# Patient Record
Sex: Male | Born: 1990 | Hispanic: Yes | Marital: Single | Smoking: Never smoker
Health system: Southern US, Community
[De-identification: ages and names within clinical notes are randomized; demographics above are authoritative.]

---

## 2015-01-25 ENCOUNTER — Emergency Department
Admission: EM | Admit: 2015-01-25 | Discharge: 2015-01-25 | Disposition: A | Discharge status: Home / Self Care | Payer: Self-pay | Attending: Emergency Medicine | Admitting: Emergency Medicine

## 2015-01-25 ENCOUNTER — Encounter: Payer: Self-pay | Admitting: Emergency Medicine

## 2015-01-25 ENCOUNTER — Emergency Department: Payer: Self-pay

## 2015-01-25 DIAGNOSIS — S0191XA Laceration without foreign body of unspecified part of head, initial encounter: Secondary | ICD-10-CM

## 2015-01-25 DIAGNOSIS — S0990XA Unspecified injury of head, initial encounter: Secondary | ICD-10-CM

## 2015-01-25 DIAGNOSIS — Y9389 Activity, other specified: Secondary | ICD-10-CM | POA: Insufficient documentation

## 2015-01-25 DIAGNOSIS — Y998 Other external cause status: Secondary | ICD-10-CM | POA: Insufficient documentation

## 2015-01-25 DIAGNOSIS — S0101XA Laceration without foreign body of scalp, initial encounter: Secondary | ICD-10-CM | POA: Insufficient documentation

## 2015-01-25 DIAGNOSIS — Y9289 Other specified places as the place of occurrence of the external cause: Secondary | ICD-10-CM | POA: Insufficient documentation

## 2015-01-25 MED ORDER — LIDOCAINE HCL (PF) 1 % IJ SOLN
INTRAMUSCULAR | Status: AC
  Filled 2015-01-25: qty 5

## 2015-01-25 MED ORDER — LIDOCAINE-EPINEPHRINE-TETRACAINE (LET) SOLUTION
3.0000 mL | Freq: Once | NASAL | Status: AC
  Administered 2015-01-25: 3 mL via TOPICAL
  Filled 2015-01-25: qty 3

## 2015-01-25 NOTE — ED Notes (Signed)
md in to assess pt.  
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
## 2015-01-25 NOTE — ED Notes (Signed)
Patient transported to CT 

## 2015-01-25 NOTE — Discharge Instructions (Signed)
Head Injury °You have received a head injury. It does not appear serious at this time. Headaches and vomiting are common following head injury. It should be easy to awaken from sleeping. Sometimes it is necessary for you to stay in the emergency department for a while for observation. Sometimes admission to the hospital may be needed. After injuries such as yours, most problems occur within the first 24 hours, but side effects may occur up to 7-10 days after the injury. It is important for you to carefully monitor your condition and contact your health care provider or seek immediate medical care if there is a change in your condition. °WHAT ARE THE TYPES OF HEAD INJURIES? °Head injuries can be as minor as a bump. Some head injuries can be more severe. More severe head injuries include: °· A jarring injury to the brain (concussion). °· A bruise of the brain (contusion). This mean there is bleeding in the brain that can cause swelling. °· A cracked skull (skull fracture). °· Bleeding in the brain that collects, clots, and forms a bump (hematoma). °WHAT CAUSES A HEAD INJURY? °A serious head injury is most likely to happen to someone who is in a car wreck and is not wearing a seat belt. Other causes of major head injuries include bicycle or motorcycle accidents, sports injuries, and falls. °HOW ARE HEAD INJURIES DIAGNOSED? °A complete history of the event leading to the injury and your current symptoms will be helpful in diagnosing head injuries. Many times, pictures of the brain, such as CT or MRI are needed to see the extent of the injury. Often, an overnight hospital stay is necessary for observation.  °WHEN SHOULD I SEEK IMMEDIATE MEDICAL CARE?  °You should get help right away if: °· You have confusion or drowsiness. °· You feel sick to your stomach (nauseous) or have continued, forceful vomiting. °· You have dizziness or unsteadiness that is getting worse. °· You have severe, continued headaches not relieved by  medicine. Only take over-the-counter or prescription medicines for pain, fever, or discomfort as directed by your health care provider. °· You do not have normal function of the arms or legs or are unable to walk. °· You notice changes in the black spots in the center of the colored part of your eye (pupil). °· You have a clear or bloody fluid coming from your nose or ears. °· You have a loss of vision. °During the next 24 hours after the injury, you must stay with someone who can watch you for the warning signs. This person should contact local emergency services (911 in the [place] if you have seizures, you become unconscious, or you are unable to wake up. °HOW CAN I PREVENT A HEAD INJURY IN THE FUTURE? °The most important factor for preventing major head injuries is avoiding motor vehicle accidents.  To minimize the potential for damage to your head, it is crucial to wear seat belts while riding in motor vehicles. Wearing helmets while bike riding and playing collision sports (like football) is also helpful. Also, avoiding dangerous activities around the house will further help reduce your risk of head injury.  °WHEN CAN I RETURN TO NORMAL ACTIVITIES AND ATHLETICS? °You should be reevaluated by your health care provider before returning to these activities. If you have any of the following symptoms, you should not return to activities or contact sports until 1 week after the symptoms have stopped: °· Persistent headache. °· Dizziness or vertigo. °· Poor attention and concentration. °· Confusion. °·   Memory problems. °· Nausea or vomiting. °· Fatigue or tire easily. °· Irritability. °· Intolerant of bright lights or loud noises. °· Anxiety or depression. °· Disturbed sleep. °MAKE SURE YOU:  °· Understand these instructions. °· Will watch your condition. °· Will get help right away if you are not doing well or get worse. °Document Released: 05/09/2005 Document Revised: 05/14/2013 Document Reviewed:  01/14/2013 °ExitCare® Patient Information ©2015 [employer]. This information is not intended to replace advice given to you by your health care provider. Make sure you discuss any questions you have with your health care provider. ° °Sutured Wound Care °Sutures are stitches that can be used to close wounds. Wound care helps prevent pain and infection.  °HOME CARE INSTRUCTIONS  °· Rest and elevate the injured area until all the pain and swelling are gone. °· Only take over-the-counter or prescription medicines for pain, discomfort, or fever as directed by your caregiver. °· After 48 hours, gently wash the area with mild soap and water once a day, or as directed. Rinse off the soap. Pat the area dry with a clean towel. Do not rub the wound. This may cause bleeding. °· Follow your caregiver's instructions for how often to change the bandage (dressing). Stop using a dressing after 2 days or after the wound stops draining. °· If the dressing sticks, moisten it with soapy water and gently remove it. °· Apply ointment on the wound as directed. °· Avoid stretching a sutured wound. °· Drink enough fluids to keep your urine clear or pale yellow. °· Follow up with your caregiver for suture removal as directed. °· Use sunscreen on your wound for the next 3 to 6 months so the scar will not darken. °SEEK IMMEDIATE MEDICAL CARE IF:  °· Your wound becomes red, swollen, hot, or tender. °· You have increasing pain in the wound. °· You have a red streak that extends from the wound. °· There is pus coming from the wound. °· You have a fever. °· You have shaking chills. °· There is a bad smell coming from the wound. °· You have persistent bleeding from the wound. °MAKE SURE YOU:  °· Understand these instructions. °· Will watch your condition. °· Will get help right away if you are not doing well or get worse. °Document Released: 06/16/2004 Document Revised: 08/01/2011 Document Reviewed: 09/12/2010 °ExitCare® Patient Information ©2015  [employer]. This information is not intended to replace advice given to you by your health care provider. Make sure you discuss any questions you have with your health care provider. ° °
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
## 2015-02-03 ENCOUNTER — Encounter: Payer: Self-pay | Admitting: *Deleted

## 2015-02-03 ENCOUNTER — Emergency Department
Admission: EM | Admit: 2015-02-03 | Discharge: 2015-02-03 | Discharge status: Against Medical Advice | Payer: Self-pay | Attending: Emergency Medicine | Admitting: Emergency Medicine

## 2015-02-03 DIAGNOSIS — Z4802 Encounter for removal of sutures: Secondary | ICD-10-CM | POA: Insufficient documentation

## 2015-02-03 NOTE — ED Notes (Signed)
Pt here for suture removal on the back of his head.

## 2015-02-03 NOTE — ED Notes (Signed)
Pt has been called several times in the lobby without response

## 2015-02-03 NOTE — ED Notes (Signed)
Pt called, no response in lobby
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
## 2018-04-12 ENCOUNTER — Emergency Department: Payer: No Typology Code available for payment source

## 2018-04-12 ENCOUNTER — Emergency Department
Admission: EM | Admit: 2018-04-12 | Discharge: 2018-04-12 | Disposition: A | Discharge status: Home / Self Care | Payer: No Typology Code available for payment source | Attending: Emergency Medicine | Admitting: Emergency Medicine

## 2018-04-12 ENCOUNTER — Other Ambulatory Visit: Payer: Self-pay

## 2018-04-12 DIAGNOSIS — Y9389 Activity, other specified: Secondary | ICD-10-CM | POA: Diagnosis not present

## 2018-04-12 DIAGNOSIS — S8002XA Contusion of left knee, initial encounter: Secondary | ICD-10-CM

## 2018-04-12 DIAGNOSIS — S4991XA Unspecified injury of right shoulder and upper arm, initial encounter: Secondary | ICD-10-CM | POA: Diagnosis present

## 2018-04-12 DIAGNOSIS — Y998 Other external cause status: Secondary | ICD-10-CM | POA: Diagnosis not present

## 2018-04-12 DIAGNOSIS — S40011A Contusion of right shoulder, initial encounter: Secondary | ICD-10-CM | POA: Insufficient documentation

## 2018-04-12 DIAGNOSIS — Y92410 Unspecified street and highway as the place of occurrence of the external cause: Secondary | ICD-10-CM | POA: Diagnosis not present

## 2018-04-12 MED ORDER — MELOXICAM 15 MG PO TABS: 15.0000 mg | ORAL_TABLET | Freq: Every day | ORAL | 0 refills | Status: AC
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
